# Patient Record
Sex: Female | Born: 1980 | Race: White | Hispanic: No | Marital: Single | Smoking: Never smoker
Health system: Southern US, Community
[De-identification: ages and names within clinical notes are randomized; demographics above are authoritative.]

## PROBLEM LIST (undated history)

## (undated) DIAGNOSIS — F329 Major depressive disorder, single episode, unspecified: Secondary | ICD-10-CM

## (undated) DIAGNOSIS — F32A Depression, unspecified: Secondary | ICD-10-CM

## (undated) HISTORY — DX: Major depressive disorder, single episode, unspecified: F32.9

## (undated) HISTORY — DX: Depression, unspecified: F32.A

---

## 2008-11-05 ENCOUNTER — Emergency Department (HOSPITAL_BASED_OUTPATIENT_CLINIC_OR_DEPARTMENT_OTHER): Admission: EM | Admit: 2008-11-05 | Discharge: 2008-11-06 | Payer: Self-pay | Admitting: Emergency Medicine

## 2009-02-24 ENCOUNTER — Ambulatory Visit: Payer: Self-pay | Admitting: Family Medicine

## 2009-02-25 ENCOUNTER — Encounter: Payer: Self-pay | Admitting: Family Medicine

## 2009-02-25 LAB — CONVERTED CEMR LAB: Hepatitis B-Post: 1000 milliintl units/mL

## 2009-07-23 ENCOUNTER — Telehealth: Payer: Self-pay | Admitting: Family Medicine

## 2009-07-23 ENCOUNTER — Ambulatory Visit: Payer: Self-pay | Admitting: Family Medicine

## 2009-07-23 DIAGNOSIS — M542 Cervicalgia: Secondary | ICD-10-CM

## 2009-07-23 DIAGNOSIS — F411 Generalized anxiety disorder: Secondary | ICD-10-CM | POA: Insufficient documentation

## 2009-08-31 ENCOUNTER — Emergency Department (HOSPITAL_BASED_OUTPATIENT_CLINIC_OR_DEPARTMENT_OTHER): Admission: EM | Admit: 2009-08-31 | Discharge: 2009-08-31 | Payer: Self-pay | Admitting: Emergency Medicine

## 2009-09-02 ENCOUNTER — Ambulatory Visit: Payer: Self-pay | Admitting: Family Medicine

## 2009-09-02 DIAGNOSIS — N760 Acute vaginitis: Secondary | ICD-10-CM | POA: Insufficient documentation

## 2009-09-02 DIAGNOSIS — L0291 Cutaneous abscess, unspecified: Secondary | ICD-10-CM | POA: Insufficient documentation

## 2009-09-02 DIAGNOSIS — J069 Acute upper respiratory infection, unspecified: Secondary | ICD-10-CM | POA: Insufficient documentation

## 2009-09-02 DIAGNOSIS — L0390 Cellulitis, unspecified: Secondary | ICD-10-CM

## 2009-09-03 ENCOUNTER — Encounter: Payer: Self-pay | Admitting: Family Medicine

## 2009-09-04 ENCOUNTER — Encounter: Payer: Self-pay | Admitting: Family Medicine

## 2009-09-06 LAB — CONVERTED CEMR LAB: Yeast Wet Prep HPF POC: NONE SEEN

## 2009-09-07 ENCOUNTER — Telehealth: Payer: Self-pay | Admitting: Family Medicine

## 2010-02-25 ENCOUNTER — Ambulatory Visit: Payer: Self-pay | Admitting: Family Medicine

## 2010-02-25 LAB — CONVERTED CEMR LAB
Bilirubin Urine: NEGATIVE
Ketones, urine, test strip: NEGATIVE
Microalbumin U total vol: 150 mg/L
Nitrite: NEGATIVE
pH: 6

## 2010-02-28 ENCOUNTER — Ambulatory Visit: Payer: Self-pay | Admitting: Family Medicine

## 2010-03-01 ENCOUNTER — Encounter: Payer: Self-pay | Admitting: Family Medicine

## 2010-06-28 ENCOUNTER — Telehealth: Payer: Self-pay | Admitting: Family Medicine

## 2010-08-16 NOTE — Assessment & Plan Note (Signed)
Summary: Neck pain, OCPs, Anxiety   Vital Signs:  Patient profile:   30 year old female Height:      63.5 inches Weight:      139 pounds Pulse rate:   77 / minute BP sitting:   121 / 79  (left arm) Cuff size:   regular  Vitals Entered By: Kathlene November (July 23, 2009 8:55 AM) CC: needs refill on BCP-last pap 6 months ago in [place]- lives here now   Primary Care Provider:  Nani Gasser, MD  CC:  needs refill on BCP-last pap 6 months ago in [place]- lives here now.  History of Present Illness: needs refill on BCP-last pap 6 months ago in [place]- lives here now.  Pap smear was normal per her report.  She is happy with her current pill.   Slept on her neck wrong about 2 days ago.  Mostly on the right side of her neck and radiates inot her right side of her chest.  Has a flexeril at home. Used some IBU 800mg  yesterday and today and has helped.  No reflux sxs but did try a PPI - no real relief..    Feels her anxiety adn stress has been worse lately. Wondered if her zoloft may not be working as well. Yesterday accidentally took 2 sertraline (forgot had taken the first dose) and actually felt better so wonders if OK to increase her dose.  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    Current Medications (verified): 1)  Sertraline Hcl 100 Mg Tabs (Sertraline Hcl) .... Take 1/2 Tablet By Mouth Once A Day 2)  Ocella 3-0.03 Mg Tabs (Drospirenone-Ethinyl Estradiol) .... Take One Tablet By Mouth Once A Day  Allergies (verified): 1)  ! Cephalosporins  Comments:  Nurse/Medical Assistant: The patient's medications and allergies were reviewed with the patient and were updated in the Medication and Allergy Lists. Kathlene November (July 23, 2009 8:56 AM)  Physical Exam  General:  Well-developed,well-nourished,in no acute distress; alert,appropriate and cooperative throughout examination Lungs:  Normal respiratory effort, chest expands symmetrically.  Lungs are clear to auscultation, no crackles or wheezes. Heart:  Normal rate and regular rhythm. S1 and S2 normal without gallop, murmur, click, rub or other extra sounds. Msk:  Neck with normal flexion, extension, rotation right. Decreased rotation left.  Decreased side bend to the right secondary to discomfort. the muscles don't feel particularly tight but she does have some anteiror right upper chest wall pain.    Impression & Recommendations:  Problem # 1:  NECK PAIN (ICD-723.1) Assessment New Muscle spasm based on exam.  Can use her muscle relaxer she has at home. Continue her IBU 800mg  up to three times a day for pain relief. I really expect her to be better in 3-5 days.   Problem # 2:  ANXIETY (ICD-300.00) Assessment: Deteriorated Will increase to 150mg . Ifnot enought can increase to 200mg .  Hopefully this will be temporary. As her stress improves she will likely be able to decrease her medication back down to 100mg .  Her updated medication list for this problem includes:    Sertraline Hcl 100 Mg Tabs (Sertraline hcl) .Marland Kitchen... Take 1.5  tablet by mouth once a day  Problem # 3:  CONTRACEPTIVE MANAGEMENT (ICD-V25.09) Refilled her OCPs. She is happy with her current pill.   Complete Medication List: 1)  Sertraline Hcl 100 Mg Tabs (Sertraline hcl) .... Take 1.5  tablet by mouth once a day 2)  Ocella 3-0.03 Mg Tabs (Drospirenone-ethinyl estradiol) .... Take one tablet by mouth once a day Prescriptions: SERTRALINE HCL 100 MG TABS (SERTRALINE HCL) Take 1.5  tablet by mouth once a day  #45 x 1   Entered and Authorized by:   Nani Gasser MD   Signed by:   Nani Gasser MD on 07/23/2009   Method used:   Electronically to        [facility]       [address]       [place], [place]  [address]       Ph: [phone]       Fax: [phone]   RxID:   [id number] OCELLA 3-0.03 MG TABS (DROSPIRENONE-ETHINYL ESTRADIOL)  Take one tablet by mouth once a day  #1 pack x 6   Entered and Authorized by:   Nani Gasser MD   Signed by:   Nani Gasser MD on 07/23/2009   Method used:   Electronically to        [facility]       [address]       [place], [place]  [address]       Ph: [phone]       Fax: [phone]   RxID:   [id number]

## 2010-08-16 NOTE — Progress Notes (Signed)
Summary: needs muscle relaxer sent to pharmacy  Phone Note Call from Patient Call back at Home Phone [phone]   Caller: Patient Call For: Nani Gasser MD Summary of Call: Pt talked with dad and they do not have anymore muscle relaxers at home so will need one sent to pharm,acy. Send to [facility] [address] in [place]. Initial call taken by: Kathlene November,  July 23, 2009 11:45 AM    New/Updated Medications: FLEXERIL 10 MG TABS (CYCLOBENZAPRINE HCL) 1/2 to 1 tab by mouth at bedtime as needed for muscle spasm Prescriptions: FLEXERIL 10 MG TABS (CYCLOBENZAPRINE HCL) 1/2 to 1 tab by mouth at bedtime as needed for muscle spasm  #10 x 0   Entered and Authorized by:   Nani Gasser MD   Signed by:   Nani Gasser MD on 07/23/2009   Method used:   Electronically to        [facility] ([facility]       [address]       [place], [place]  [address]       Ph: [phone]       Fax: [phone]   RxID:   [id number]

## 2010-08-16 NOTE — Letter (Signed)
Summary: Work Excuse  [facility]  [address] [place] [address]   [place], [place] [address]   Phone: [phone]  Fax: [phone]    Today's Date: September 02, 2009  Name of Patient: Alice Hall  The above named patient had a medical visit today at:  am / pm.  Please take this into consideration when reviewing the time away from work/school.    Special Instructions:  [  ] None  [  ] To be off the remainder of today, returning to the normal work / school schedule tomorrow.  [  ] To be off until the next scheduled appointment on ______________________.  [  ] Other ________________________________________________________________ ________________________________________________________________________   Sincerely yours,   Nani Gasser MD

## 2010-08-16 NOTE — Progress Notes (Signed)
Summary: Vaginal discharge  Phone Note Call from Patient Call back at Home Phone [phone]   Caller: Patient Call For: Nani Gasser MD Summary of Call: Pt calls and states she is having a green vaginal discharge- please advise Initial call taken by: Kathlene November,  September 07, 2009 9:55 AM  Follow-up for Phone Call        WEt prep and GC/chlam were normal so may be from the ABX.  Is she having any itchin.   Follow-up by: Nani Gasser MD,  September 07, 2009 11:16 AM  Additional Follow-up for Phone Call Additional follow up Details #1::        very little itching- just wondering if normal from antibiotic Additional Follow-up by: Kathlene November,  September 07, 2009 11:24 AM

## 2010-08-16 NOTE — Assessment & Plan Note (Signed)
Summary: TB skin test  Nurse Visit   Allergies: 1)  ! Cephalosporins  Immunizations Administered:  PPD Skin Test:    Vaccine Type: PPD    Site: right forearm    Mfr: Sanofi Pasteur    Dose: 0.1 ml    Route: ID    Given by: Sue Lush McCrimmon CMA, (AAMA)    Exp. Date: 05/15/2011    Lot #: [id number]  Orders Added: 1)  TB Skin Test [86580] 2)  Admin 1st Vaccine [90471]  Appended Document: TB skin test    Clinical Lists Changes  Observations: Added new observation of TB PPDRESULT: negative (03/03/2010 8:58) Added new observation of PPD RESULT: < 5mm (03/03/2010 8:58) Added new observation of TB-PPD RDDTE: 03/02/2010 (03/03/2010 8:58)       PPD Results    Date of reading: 03/02/2010    Results: < 5mm    Interpretation: negative

## 2010-08-16 NOTE — Assessment & Plan Note (Signed)
Summary: infected tattoo, vaginitis, etc   Vital Signs:  Patient profile:   30 year old female Height:      63.5 inches Weight:      138 pounds BMI:     24.15 O2 Sat:      98 % on Room air Temp:     101 degrees F oral Pulse rate:   116 / minute BP sitting:   136 / 79  (right arm) Cuff size:   regular  Vitals Entered By: Kathlene November /April(September 02, 2009 4:32 PM)  O2 Flow:  Room air CC: infection on right foot from tattoo, also c/o flu like symptoms   Primary Care Provider:  Nani Gasser, MD  CC:  infection on right foot from tattoo and also c/o flu like symptoms.  History of Present Illness: infection on right foot from tattoo, also c/o flu like symptoms. Tatto done on Sunday and then U.C on Tuesday.  On Augmentin x 1 dose but hasn't had a change to fill it so has been taking some old erythromycin that she had at home.  Foot feels it is worse.  Now getting red streaks up her leg and looks more swollen.  Slightly runny nose adn suddenly feels fatigued, muscle aches, fever started this afternoon.  wonders if she may have the flu.  She is a Education officer, community.   No ST or some nausea from the ABX.  Some diarrhea from the augmentin. No abdominal pain.   also have external vaginal pain and swelling.  STarted about 2 weeks ago after finished her period. Thought is was a yeast infection initally so has been doing OTC herbal trreatment called AZO. No urinary sxs.  Found out her boyfriend was having sex with others that she didn't know about. Now worried about STDs and would like to be checked.   Current Medications (verified): 1)  Sertraline Hcl 100 Mg Tabs (Sertraline Hcl) .... Take 1.5  Tablet By Mouth Once A Day 2)  Ocella 3-0.03 Mg Tabs (Drospirenone-Ethinyl Estradiol) .... Take One Tablet By Mouth Once A Day  Allergies (verified): 1)  ! Cephalosporins  Social History: Reviewed history from 02/24/2009 and no changes required. 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   Never Smoked Alcohol use-yes Drug use-no Regular exercise-yes  Physical Exam  General:  Well-developed,well-nourished,in no acute distress; alert,appropriate and cooperative throughout examination Head:  Normocephalic and atraumatic without obvious abnormalities. No apparent alopecia or balding. Eyes:  No corneal or conjunctival inflammation noted. EOMI. Perrla.  Ears:  External ear exam shows no significant lesions or deformities.  Otoscopic examination reveals clear canals, tympanic membranes are intact bilaterally without bulging, retraction, inflammation or discharge. Hearing is grossly normal bilaterally. Nose:  External nasal examination shows no deformity or inflammation. Nasal mucosa are pink and moist without lesions or exudates. Mouth:  Oral mucosa and oropharynx without lesions or exudates.  Teeth in good repair. Neck:  No deformities, masses, or tenderness noted. Lungs:  Normal respiratory effort, chest expands symmetrically. Lungs are clear to auscultation, no crackles or wheezes. Heart:  Normal rate and regular rhythm. S1 and S2 normal without gallop, murmur, click, rub or other extra sounds. Genitalia:  normal introitus, no vaginal discharge, mucosa pink and moist, and 2  labial ulcer(s).  Also some ingrown hairs over the mons pubis.  Pulses:  DP 2+  Neurologic:  alert & oriented X3.   Skin:  Right foot is erythematous on  the top and with some red streaking above the ankle. Has 2 + edema on the top of the foot. the tatoo is starting to scab over.  Cervical Nodes:  No lymphadenopathy noted Psych:  Cognition and judgment appear intact. Alert and cooperative with normal attention span and concentration. No apparent delusions, illusions, hallucinations   Impression & Recommendations:  Problem # 1:  CELLULITIS AND ABSCESS OF UNSPECIFIED SITE (ICD-682.9) Assessment New Will cover for MRSA. She realy only had one dose of  Augmentin which may explai why her foot is getting worse. I gets worse over the weekend then go ED as may need IV Abx.  Keep elevated and ACE wrap if able.  No drainaing wounds which is reassuring. She has had fever ad chilld this afternooon but not sure ifcoming down with viral flu like illness or if related to cellulitis.  Her updated medication list for this problem includes:    Bactrim Ds 800-160 Mg Tabs (Sulfamethoxazole-trimethoprim) .Marland Kitchen... 2 tabs by mouth two times a day for 10 days  Problem # 2:  VAGINITIS (ICD-616.10) Assessment: New  Discussed that I do recommend STD testing.  Will sent wet prep adn did get a viral culture sample from one of the genital lesions. 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  Her updated medication list for this problem includes:    Bactrim Ds 800-160 Mg Tabs (Sulfamethoxazole-trimethoprim) .Marland Kitchen... 2 tabs by mouth two times a day for 10 days  Orders: T-Herpes Culture (Routine) ([id number]) T-Wet Prep by Molecular Probe [id number]) T-HIV Antibody  (Reflex) [id number]) T-RPR (Syphilis) ([id number]) T-Hepatitis Acute Panel ([id number]) T-Chlamydia & GC Probe, Urine ([id number])  Problem # 3:  URI (ICD-465.9)  Instructed on symptomatic treatment. Call if symptoms persist or worsen.   Complete Medication List: 1)  Sertraline Hcl 100 Mg Tabs (Sertraline hcl) .... Take 1.5  tablet by mouth once a day 2)  Ocella 3-0.03 Mg Tabs (Drospirenone-ethinyl estradiol) .... Take one tablet by mouth once a day 3)  Bactrim Ds 800-160 Mg Tabs (Sulfamethoxazole-trimethoprim) .... 2 tabs by mouth two times a day for 10 days 4)  Famvir 250 Mg Tabs (Famciclovir) .... Take 1 tablet by mouth three times a day for 7 days Prescriptions: FAMVIR 250 MG TABS (FAMCICLOVIR) Take 1 tablet by mouth three times a day for 7 days  #21 x 0   Entered and Authorized by:   Nani Gasser MD   Signed by:   Nani Gasser MD on 09/02/2009   Method used:    Electronically to        [facility] [phone]* (retail)       [address] [place], [place]  [address]       Ph: [phone]       Fax: [phone]   RxID:   [id number] BACTRIM DS 800-160 MG TABS (SULFAMETHOXAZOLE-TRIMETHOPRIM) 2 tabs by mouth two times a day for 10 days  #40 x 0   Entered and Authorized by:   Nani Gasser MD   Signed by:   Nani Gasser MD on 09/02/2009   Method used:   Electronically to        [facility] [phone]* (retail)       [address]       [address]       [place], [place]  [address]  Ph: [phone]       Fax: [phone]   RxID:   [id number] FAMVIR 250 MG TABS (FAMCICLOVIR) Take 1 tablet by mouth three times a day for 7 days  #21 x 0   Entered and Authorized by:   Nani Gasser MD   Signed by:   Nani Gasser MD on 09/02/2009   Method used:   Electronically to        [facility]       [address]       [place], [place]  [address]       Ph: [phone]       Fax: [phone]   RxID:   [id number] BACTRIM DS 800-160 MG TABS (SULFAMETHOXAZOLE-TRIMETHOPRIM) 2 tabs by mouth two times a day for 10 days  #40 x 0   Entered and Authorized by:   Nani Gasser MD   Signed by:   Nani Gasser MD on 09/02/2009   Method used:   Electronically to        [facility] ([facility])       [address]       [place], [place]  [address]       Ph: [phone]       Fax: [phone]   RxID:   [id number]   Appended Document: infected tattoo, vaginitis, etc Left tonsil was mildly swollen and erythematous.

## 2010-08-16 NOTE — Miscellaneous (Signed)
Summary: Vaccine Records  Vaccine Records   Imported By: Lanelle Bal 03/18/2010 11:05:55  _____________________________________________________________________  External Attachment:    Type:   Image     Comment:   External Document

## 2010-08-16 NOTE — Miscellaneous (Signed)
  Clinical Lists Changes  Observations: Added new observation of VARICELLA#2: Historical (08/07/2007 12:48) Added new observation of MMR #2: Historical (05/08/2002 12:48) Added new observation of DPT #5: Historical (03/05/1989 12:48) Added new observation of DPT #4: Historical (12/08/1983 12:48) Added new observation of OPV #4: Historical (12/01/1983 12:48) Added new observation of MMR #1: Historical (07/18/1983 12:48) Added new observation of OPV #3: Historical (07/29/1982 12:48) Added new observation of DPT #3: Historical (07/29/1982 12:48) Added new observation of OPV #2: Historical (05/24/1982 12:48) Added new observation of DPT #2: Historical (05/24/1982 12:48) Added new observation of OPV #1: Historical (03/29/1982 12:48) Added new observation of DPT #1: Historical (03/29/1982 12:48)      Immunization History:  DPT Immunization History:    DPT # 1:  historical (03/29/1982)    DPT # 2:  historical (05/24/1982)    DPT # 3:  historical (07/29/1982)    DPT # 4:  historical (12/08/1983)    DPT # 5:  historical (03/05/1989)  Polio Immunization History:    Polio # 1:  historical (03/29/1982)    Polio # 2:  historical (05/24/1982)    Polio # 3:  historical (07/29/1982)    Polio # 4:  historical (12/01/1983)  MMR Immunization History:    MMR # 1:  historical (07/18/1983)    MMR # 2:  historical (05/08/2002)  Varicella Immunization History:    Varicella # 2:  historical (08/07/2007)

## 2010-08-16 NOTE — Assessment & Plan Note (Signed)
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  Vision Screening:Left eye w/o correction: 20 / 30 Right Eye w/o correction: 20 / 30 Both eyes w/o correction:  20/ 30        Vision Entered By: Avon Gully CMA, (AAMA) (February 25, 2010 1:50 PM)  20db HL: Left  500 hz: No Response 1000 hz: No Response 2000 hz: No Response 4000 hz: No Response Right  500 hz: No Response 1000 hz: No Response 2000 hz: No Response 4000 hz: No Response  25db HL: Left  500 hz: No Response 1000 hz: 25db 2000 hz: 25db 4000 hz: No Response Right  500 hz: No Response 1000 hz: 25db 2000 hz: 25db 4000 hz: 25db  40db HL: Left  500 hz: 40db 1000 hz: 40db 2000 hz: 40db 4000 hz: 40db Right  500 hz: 40db 1000 hz: 40db 2000 hz: 40db 4000 hz: 40db    Primary Care Provider:  Nani Gasser, MD  CC:  CPE, form for nursing school, and no pap.  History of Present Illness: Doing well overall. 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  She also has some forms to complete for school She did bring in a copy of her vaccines record as well. She has no compaints today.  She is not using the valtrex daily.   Current Medications (verified): 1)  Sertraline Hcl 100 Mg Tabs (Sertraline Hcl) .... Take 1.5  Tablet By Mouth Once A Day 2)  Ocella 3-0.03 Mg Tabs (Drospirenone-Ethinyl Estradiol) .... Take One Tablet By Mouth Once A Day 3)  Famvir 250 Mg Tabs (Famciclovir) .... Take 1 Tablet By Mouth Three Times A Day For 7 Days 4)  Valtrex 1 Gm Tabs (Valacyclovir Hcl) .... Take 1 Tablet By Mouth Once A Day  Allergies (verified): 1)  ! Cephalosporins  Comments:  Nurse/Medical Assistant: The patient's  medications and allergies were reviewed with the patient and were updated in the Medication and Allergy Lists. 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   Never Smoked Alcohol use-yes Drug use-no Regular exercise-yes  Review of Systems  The patient denies anorexia, fever, weight loss, weight gain, vision loss, decreased hearing, hoarseness, chest pain, syncope, dyspnea on exertion, peripheral edema, prolonged cough, headaches, hemoptysis, abdominal pain, melena, hematochezia, severe indigestion/heartburn, hematuria, incontinence, genital sores, muscle weakness, suspicious skin lesions, transient blindness, difficulty walking, depression, unusual weight change, abnormal bleeding, and enlarged lymph nodes.    Physical Exam  General:  Well-developed,well-nourished,in no acute distress; alert,appropriate and cooperative throughout examination Head:  Normocephalic and atraumatic without obvious abnormalities. No apparent alopecia or balding. Eyes:  No corneal or conjunctival inflammation noted. EOMI. Perrla.  Ears:  External ear exam shows no significant lesions or deformities.  Otoscopic examination reveals clear canals, tympanic membranes are intact bilaterally without bulging, retraction, inflammation or discharge. Hearing is  grossly normal bilaterally. Nose:  External nasal examination shows no deformity or inflammation. Nasal mucosa are pink and moist without lesions or exudates. Mouth:  Oral mucosa and oropharynx without lesions or exudates.  Teeth in good repair. Neck:  No deformities, masses, or tenderness noted. Lungs:  Normal respiratory effort, chest expands symmetrically.  Lungs are clear to auscultation, no crackles or wheezes. Heart:  Normal rate and regular rhythm. S1 and S2 normal without gallop, murmur, click, rub or other extra sounds. Abdomen:  Bowel sounds positive,abdomen soft and non-tender without masses, organomegaly or hernias noted. Msk:  No deformity or scoliosis noted of thoracic or lumbar spine.  NROM and strength in the UE and LE.  Pulses:  R and L carotid,radial,dorsalis pedis and posterior tibial pulses are full and equal bilaterally Extremities:  No clubbing, cyanosis, edema, or deformity noted with normal full range of motion of all joints.   Neurologic:  No cranial nerve deficits noted. Station and gait are normal. DTRs are symmetrical throughout. Sensory, motor and coordinative functions appear intact. Skin:  no rashes.   Cervical Nodes:  No lymphadenopathy noted Psych:  Cognition and judgment appear intact. Alert and cooperative with normal attention span and concentration. No apparent delusions, illusions, hallucinations   Impression & Recommendations:  Problem # 1:  HEALTH SCREENING (ICD-V70.0)  Exam is normal today Encourage regular exercise and healthy diet From completed [facility] are [facility] to date.  Had Hep B titers last year.  will f/u next week for PPD placement.   Orders: Creatinine  (32671) UA Dipstick w/o Micro (automated)  (81003) Urine Microalbumin (24580)  Complete Medication List: 1)  Sertraline Hcl 100 Mg Tabs (Sertraline hcl) .... Take 1.5  tablet by mouth once a day 2)  Ocella 3-0.03 Mg Tabs (Drospirenone-ethinyl estradiol) .... Take one tablet by mouth once a day 3)  Famvir 250 Mg Tabs (Famciclovir) .... Take 1 tablet by mouth three times a day for 7 days 4)  Valtrex 1 Gm Tabs (Valacyclovir hcl) .... Take 1 tablet by mouth once a day  Laboratory Results   Urine Tests  Date/Time Received: 02/25/10 Date/Time Reported: 02/25/10  Routine Urinalysis   Color: yellow Appearance: Clear Glucose: negative    (Normal Range: Negative) Bilirubin: negative   (Normal Range: Negative) Ketone: negative   (Normal Range: Negative) Spec. Gravity: >=1.030   (Normal Range: 1.003-1.035) Blood: small   (Normal Range: Negative) pH: 6.0   (Normal Range: 5.0-8.0) Protein: 100   (Normal Range: Negative) Urobilinogen: 0.2   (Normal Range: 0-1) Nitrite: negative   (Normal Range: Negative) Leukocyte Esterace: small   (Normal Range: Negative)  Microalbumin (urine): 150 mg/L Creatinine: 300mg /dL  A:C Ratio [id number]$ /g

## 2010-08-18 NOTE — Progress Notes (Signed)
Summary: meds  Phone Note Call from Patient Call back at [phone]   Caller: Patient Call For: Nani Gasser MD Summary of Call: Pt called and states the valcyclovir is too expensive for her and wantes to know if there is something that can help.Pt is not taking it every day.Pt states she is in a little bit of pain but Ibuprofen does help a little Initial call taken by: Avon Gully CMA, Duncan Dull),  June 28, 2010 11:33 AM  Follow-up for Phone Call        Change to acyclorvir.  Follow-up by: Nani Gasser MD,  June 28, 2010 11:41 AM    New/Updated Medications: ACYCLOVIR 400 MG TABS (ACYCLOVIR) Take 1 tablet by mouth two times a day Prescriptions: ACYCLOVIR 400 MG TABS (ACYCLOVIR) Take 1 tablet by mouth two times a day  #60 x 6   Entered and Authorized by:   Nani Gasser MD   Signed by:   Nani Gasser MD on 06/28/2010   Method used:   Electronically to        [facility] # [phone]* (retail)       [address] [place], [place]  [address]       Ph: [phone]       Fax: [phone]   RxID:   [id number]

## 2010-10-03 ENCOUNTER — Ambulatory Visit: Payer: Self-pay

## 2010-10-10 ENCOUNTER — Ambulatory Visit (INDEPENDENT_AMBULATORY_CARE_PROVIDER_SITE_OTHER): Payer: PRIVATE HEALTH INSURANCE | Admitting: Family Medicine

## 2010-10-10 DIAGNOSIS — Z020 Encounter for examination for admission to educational institution: Secondary | ICD-10-CM

## 2010-10-10 DIAGNOSIS — Z0289 Encounter for other administrative examinations: Secondary | ICD-10-CM

## 2010-10-13 ENCOUNTER — Ambulatory Visit: Payer: PRIVATE HEALTH INSURANCE | Admitting: Family Medicine

## 2010-10-13 NOTE — Progress Notes (Signed)
  Subjective:    Patient ID: Alice Hall, female    DOB: 10-Aug-1980, 30 y.o.   MRN: [id number]  HPI    Review of Systems     Objective:   Physical Exam        Assessment & Plan:  A

## 2010-10-24 ENCOUNTER — Ambulatory Visit (INDEPENDENT_AMBULATORY_CARE_PROVIDER_SITE_OTHER): Payer: PRIVATE HEALTH INSURANCE | Admitting: Family Medicine

## 2010-10-24 DIAGNOSIS — Z111 Encounter for screening for respiratory tuberculosis: Secondary | ICD-10-CM

## 2010-10-24 NOTE — Progress Notes (Signed)
  Subjective:    Patient ID: Alice Hall, female    DOB: [birth year], 30 y.o.   MRN: [id number]  HPI TB skin test placed for school.     Review of Systems     Objective:   Physical Exam        Assessment & Plan:

## 2010-10-25 ENCOUNTER — Ambulatory Visit: Payer: PRIVATE HEALTH INSURANCE

## 2010-11-14 ENCOUNTER — Other Ambulatory Visit: Payer: Self-pay | Admitting: Family Medicine

## 2010-11-16 ENCOUNTER — Telehealth: Payer: Self-pay | Admitting: Family Medicine

## 2010-11-16 NOTE — Telephone Encounter (Signed)
Pt came in to the office to pickup a copy of the [facility] shot records and also gave courtesy copy of EMR imm records. Jarvis Newcomer, LPN Domingo Dimes

## 2011-01-09 ENCOUNTER — Other Ambulatory Visit: Payer: Self-pay | Admitting: Family Medicine

## 2011-01-16 ENCOUNTER — Other Ambulatory Visit: Payer: Self-pay | Admitting: Family Medicine

## 2011-03-08 ENCOUNTER — Telehealth: Payer: Self-pay | Admitting: Family Medicine

## 2011-03-08 MED ORDER — DROSPIRENONE-ETHINYL ESTRADIOL 3-0.03 MG PO TABS: 1.0000 | ORAL_TABLET | Freq: Every day | ORAL | Status: AC
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
## 2011-03-09 ENCOUNTER — Telehealth: Payer: Self-pay | Admitting: Family Medicine

## 2011-03-09 NOTE — Telephone Encounter (Signed)
Patient called left vm message advising she is attending [employer] [employer] [facility] and was having her prescriptions sent to [facility] [address] in [place] but she needs them sent to [facility] pharmacy now. There number is [phone]

## 2011-04-11 ENCOUNTER — Other Ambulatory Visit: Payer: Self-pay | Admitting: Family Medicine

## 2011-04-11 MED ORDER — SERTRALINE HCL 100 MG PO TABS: 150.0000 mg | ORAL_TABLET | Freq: Every day | ORAL | Status: AC

## 2011-04-11 NOTE — Telephone Encounter (Signed)
Pt called and needs refill of her sertraline 100 mg tabs.   Plan:  Reviewed the pt chart and she is due for CPE and was notified to do so before running out of 30 day refill sent to [facility].

## 2011-05-05 ENCOUNTER — Encounter: Payer: Self-pay | Admitting: Family Medicine

## 2011-05-10 ENCOUNTER — Encounter: Payer: PRIVATE HEALTH INSURANCE | Admitting: Family Medicine

## 2011-05-10 DIAGNOSIS — Z0289 Encounter for other administrative examinations: Secondary | ICD-10-CM

## 2015-02-26 ENCOUNTER — Other Ambulatory Visit: Payer: Self-pay

## 2015-03-04 ENCOUNTER — Ambulatory Visit: Payer: PRIVATE HEALTH INSURANCE | Admitting: Family Medicine

## 2015-05-05 ENCOUNTER — Emergency Department (HOSPITAL_BASED_OUTPATIENT_CLINIC_OR_DEPARTMENT_OTHER)
Admission: EM | Admit: 2015-05-05 | Discharge: 2015-05-06 | Disposition: A | Discharge status: Home / Self Care | Payer: Managed Care, Other (non HMO) | Attending: Emergency Medicine | Admitting: Emergency Medicine

## 2015-05-05 ENCOUNTER — Encounter (HOSPITAL_BASED_OUTPATIENT_CLINIC_OR_DEPARTMENT_OTHER): Payer: Self-pay

## 2015-05-05 ENCOUNTER — Emergency Department (HOSPITAL_BASED_OUTPATIENT_CLINIC_OR_DEPARTMENT_OTHER): Payer: Managed Care, Other (non HMO)

## 2015-05-05 DIAGNOSIS — S199XXA Unspecified injury of neck, initial encounter: Secondary | ICD-10-CM | POA: Insufficient documentation

## 2015-05-05 DIAGNOSIS — Y998 Other external cause status: Secondary | ICD-10-CM | POA: Insufficient documentation

## 2015-05-05 DIAGNOSIS — Z23 Encounter for immunization: Secondary | ICD-10-CM | POA: Diagnosis not present

## 2015-05-05 DIAGNOSIS — Z79899 Other long term (current) drug therapy: Secondary | ICD-10-CM | POA: Diagnosis not present

## 2015-05-05 DIAGNOSIS — Y9389 Activity, other specified: Secondary | ICD-10-CM | POA: Diagnosis not present

## 2015-05-05 DIAGNOSIS — T07XXXA Unspecified multiple injuries, initial encounter: Secondary | ICD-10-CM

## 2015-05-05 DIAGNOSIS — T148 Other injury of unspecified body region: Secondary | ICD-10-CM | POA: Insufficient documentation

## 2015-05-05 DIAGNOSIS — Z3202 Encounter for pregnancy test, result negative: Secondary | ICD-10-CM | POA: Diagnosis not present

## 2015-05-05 DIAGNOSIS — Y92410 Unspecified street and highway as the place of occurrence of the external cause: Secondary | ICD-10-CM | POA: Insufficient documentation

## 2015-05-05 DIAGNOSIS — T2141XA Corrosion of unspecified degree of chest wall, initial encounter: Secondary | ICD-10-CM | POA: Insufficient documentation

## 2015-05-05 DIAGNOSIS — T65891A Toxic effect of other specified substances, accidental (unintentional), initial encounter: Secondary | ICD-10-CM | POA: Insufficient documentation

## 2015-05-05 DIAGNOSIS — F329 Major depressive disorder, single episode, unspecified: Secondary | ICD-10-CM | POA: Diagnosis not present

## 2015-05-05 DIAGNOSIS — S4992XA Unspecified injury of left shoulder and upper arm, initial encounter: Secondary | ICD-10-CM | POA: Diagnosis present

## 2015-05-05 LAB — PREGNANCY, URINE: Preg Test, Ur: NEGATIVE

## 2015-05-05 LAB — URINALYSIS, ROUTINE W REFLEX MICROSCOPIC
GLUCOSE, UA: NEGATIVE mg/dL
HGB URINE DIPSTICK: NEGATIVE
Ketones, ur: 15 mg/dL — AB
LEUKOCYTES UA: NEGATIVE
Nitrite: NEGATIVE
PH: 7 (ref 5.0–8.0)
PROTEIN: 30 mg/dL — AB
Specific Gravity, Urine: 1.027 (ref 1.005–1.030)
Urobilinogen, UA: 1 mg/dL (ref 0.0–1.0)

## 2015-05-05 LAB — URINE MICROSCOPIC-ADD ON

## 2015-05-05 MED ORDER — KETOROLAC TROMETHAMINE 60 MG/2ML IM SOLN
60.0000 mg | Freq: Once | INTRAMUSCULAR | Status: AC
  Administered 2015-05-05: 60 mg via INTRAMUSCULAR
  Filled 2015-05-05: qty 2

## 2015-05-05 MED ORDER — METHOCARBAMOL 500 MG PO TABS: 500.0000 mg | ORAL_TABLET | Freq: Two times a day (BID) | ORAL | Status: AC

## 2015-05-05 MED ORDER — TETANUS-DIPHTH-ACELL PERTUSSIS 5-2.5-18.5 LF-MCG/0.5 IM SUSP
0.5000 mL | Freq: Once | INTRAMUSCULAR | Status: AC
  Administered 2015-05-05: 0.5 mL via INTRAMUSCULAR
  Filled 2015-05-05: qty 0.5

## 2015-05-05 MED ORDER — METHOCARBAMOL 500 MG PO TABS
1000.0000 mg | ORAL_TABLET | Freq: Once | ORAL | Status: AC
  Administered 2015-05-05: 1000 mg via ORAL
  Filled 2015-05-05: qty 2

## 2015-05-05 MED ORDER — MELOXICAM 15 MG PO TABS: 15.0000 mg | ORAL_TABLET | Freq: Every day | ORAL | Status: AC
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
## 2015-05-06 ENCOUNTER — Encounter (HOSPITAL_BASED_OUTPATIENT_CLINIC_OR_DEPARTMENT_OTHER): Payer: Self-pay | Admitting: Emergency Medicine

## 2015-05-06 NOTE — Discharge Instructions (Signed)
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
